# Patient Record
Sex: Female | Born: 2000 | Race: Black or African American | Hispanic: No | Marital: Single | Smoking: Current some day smoker
Health system: Southern US, Community
[De-identification: ages and names within clinical notes are randomized; demographics above are authoritative.]

## PROBLEM LIST (undated history)

## (undated) DIAGNOSIS — D573 Sickle-cell trait: Secondary | ICD-10-CM

## (undated) DIAGNOSIS — R011 Cardiac murmur, unspecified: Secondary | ICD-10-CM

## (undated) HISTORY — PX: WISDOM TOOTH EXTRACTION: SHX21

## (undated) HISTORY — PX: DILATION AND CURETTAGE OF UTERUS: SHX78

---
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
## 2020-03-31 DIAGNOSIS — A5901 Trichomonal vulvovaginitis: Secondary | ICD-10-CM | POA: Insufficient documentation

## 2021-02-27 ENCOUNTER — Encounter (HOSPITAL_BASED_OUTPATIENT_CLINIC_OR_DEPARTMENT_OTHER): Payer: Self-pay

## 2021-02-27 ENCOUNTER — Other Ambulatory Visit: Payer: Self-pay

## 2021-02-27 ENCOUNTER — Emergency Department (HOSPITAL_BASED_OUTPATIENT_CLINIC_OR_DEPARTMENT_OTHER): Payer: Medicaid Other

## 2021-02-27 ENCOUNTER — Emergency Department (HOSPITAL_BASED_OUTPATIENT_CLINIC_OR_DEPARTMENT_OTHER)
Admission: EM | Admit: 2021-02-27 | Discharge: 2021-02-27 | Discharge status: Against Medical Advice | Payer: Medicaid Other | Attending: Emergency Medicine | Admitting: Emergency Medicine

## 2021-02-27 DIAGNOSIS — R1031 Right lower quadrant pain: Secondary | ICD-10-CM | POA: Insufficient documentation

## 2021-02-27 DIAGNOSIS — R102 Pelvic and perineal pain: Secondary | ICD-10-CM | POA: Insufficient documentation

## 2021-02-27 DIAGNOSIS — R52 Pain, unspecified: Secondary | ICD-10-CM

## 2021-02-27 HISTORY — DX: Sickle-cell trait: D57.3

## 2021-02-27 HISTORY — DX: Cardiac murmur, unspecified: R01.1

## 2021-02-27 LAB — URINALYSIS, ROUTINE W REFLEX MICROSCOPIC
Bilirubin Urine: NEGATIVE
Glucose, UA: NEGATIVE mg/dL
Hgb urine dipstick: NEGATIVE
Ketones, ur: NEGATIVE mg/dL
Leukocytes,Ua: NEGATIVE
Nitrite: NEGATIVE
Protein, ur: NEGATIVE mg/dL
Specific Gravity, Urine: 1.02 (ref 1.005–1.030)
pH: 5.5 (ref 5.0–8.0)

## 2021-02-27 LAB — HCG, QUANTITATIVE, PREGNANCY: hCG, Beta Chain, Quant, S: <1 m[IU]/mL (ref <5)

## 2021-02-27 MED ORDER — ACETAMINOPHEN 500 MG PO TABS
1000.0000 mg | ORAL_TABLET | Freq: Once | ORAL | Status: AC
  Administered 2021-02-27: 1000 mg via ORAL
  Filled 2021-02-27: qty 2
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
## 2021-02-27 NOTE — ED Provider Notes (Signed)
[facility] EMERGENCY DEPARTMENT Provider Note   CSN: [id number] Arrival date & time: 02/27/21  1912     History Chief Complaint  Patient presents with   Abdominal Pain    Danielle Becker is a 20 y.o. female.  The history is provided by the patient.  Abdominal Pain Pain location: R groin pain. Pain quality: cramping   Pain radiates to:  Perineum Pain severity:  Severe Onset quality:  Sudden Duration:  1 day Timing:  Constant Progression:  Unchanged Chronicity:  New Context: not suspicious food intake and not trauma   Relieved by:  Nothing Worsened by:  Nothing Ineffective treatments:  None tried Associated symptoms: no anorexia, no belching, no chest pain, no chills, no constipation, no cough, no diarrhea, no dysuria, no fatigue, no fever, no flatus, no hematemesis, no hematochezia, no hematuria, no melena, no nausea, no shortness of breath, no sore throat, no vaginal bleeding, no vaginal discharge and no vomiting   Risk factors: no alcohol abuse       Past Medical History:  Diagnosis Date   Heart murmur    Sickle cell trait (HCC)     There are no problems to display for this patient.   Past Surgical History:  Procedure Laterality Date   DILATION AND CURETTAGE OF UTERUS       OB History   No obstetric history on file.     History reviewed. No pertinent family history.     Home Medications Prior to Admission medications   Medication Sig Start Date End Date Taking? Authorizing Provider  cephALEXin (KEFLEX) 500 MG capsule Take 1 capsule (500 mg total) by mouth 3 (three) times daily. 500mg  po tid x 10 days qs 07/22/14   14/3/15, MD  famotidine (PEPCID) 20 MG tablet Take 20 mg by mouth daily. 07/09/14   [provider]    Allergies    Patient has no known allergies.  Review of Systems   Review of Systems  Constitutional:  Negative for chills, fatigue and fever.  HENT:  Negative for sore throat.   Eyes:  Negative for redness.   Respiratory:  Negative for cough and shortness of breath.   Cardiovascular:  Negative for chest pain.  Gastrointestinal:  Positive for abdominal pain. Negative for anorexia, constipation, diarrhea, flatus, hematemesis, hematochezia, melena, nausea and vomiting.  Genitourinary:  Negative for dysuria, hematuria, vaginal bleeding and vaginal discharge.  Musculoskeletal:  Negative for neck stiffness.  Skin:  Negative for rash.  Neurological:  Negative for facial asymmetry.  Psychiatric/Behavioral:  Negative for agitation.   All other systems reviewed and are negative.  Physical Exam Updated Vital Signs BP 127/73 (BP Location: Left Arm)   Pulse 92   Resp (!) 28   Ht 5\' 3"  (1.6 m)   Wt 53.1 kg   LMP 02/22/2021   SpO2 100%   BMI 20.73 kg/m   Physical Exam Vitals and nursing note reviewed.  Constitutional:      General: She is not in acute distress.    Appearance: She is well-developed.  HENT:     Head: Normocephalic and atraumatic.     Nose: Nose normal.  Eyes:     Extraocular Movements: Extraocular movements intact.  Cardiovascular:     Rate and Rhythm: Normal rate and regular rhythm.     Pulses: Normal pulses.     Heart sounds: Normal heart sounds.  Pulmonary:     Effort: Pulmonary effort is normal.     Breath sounds: Normal  breath sounds.  Abdominal:     General: Abdomen is flat. Bowel sounds are normal.     Palpations: Abdomen is soft.     Tenderness: There is no rebound.  Musculoskeletal:        General: Normal range of motion.     Cervical back: Normal range of motion and neck supple.  Skin:    General: Skin is warm and dry.     Capillary Refill: Capillary refill takes less than 2 seconds.  Neurological:     General: No focal deficit present.     Mental Status: She is alert and oriented to person, place, and time.     Deep Tendon Reflexes: Reflexes normal.  Psychiatric:     Comments: Angry and aggressive     ED Results / Procedures / Treatments   Labs (all  labs ordered are listed, but only abnormal results are displayed) Results for orders placed or performed during the hospital encounter of 02/27/21  Urinalysis, Routine w reflex microscopic Urine, Clean Catch  Result Value Ref Range   Color, Urine YELLOW YELLOW   APPearance CLEAR CLEAR   Specific Gravity, Urine 1.020 1.005 - 1.030   pH 5.5 5.0 - 8.0   Glucose, UA NEGATIVE NEGATIVE mg/dL   Hgb urine dipstick NEGATIVE NEGATIVE   Bilirubin Urine NEGATIVE NEGATIVE   Ketones, ur NEGATIVE NEGATIVE mg/dL   Protein, ur NEGATIVE NEGATIVE mg/dL   Nitrite NEGATIVE NEGATIVE   Leukocytes,Ua NEGATIVE NEGATIVE  hCG, quantitative, pregnancy  Result Value Ref Range   hCG, Beta Chain, Quant, S <1 <5 mIU/mL   No results found.  EKG None  Radiology No results found.  Procedures Procedures   Medications Ordered in ED Medications  acetaminophen (TYLENOL) tablet 1,000 mg (has no administration in time range)    ED Course  I have reviewed the triage vital signs and the nursing notes.  Pertinent labs & imaging results that were available during my care of the patient were reviewed by me and considered in my medical decision making (see chart for details).    Patient was not pregnant and ovarian ultrasound for torsion was ordered.  Patient eloped during the care before ultrasound.    Danielle Becker was evaluated in Emergency Department on 02/27/2021 for the symptoms described in the history of present illness. She was evaluated in the context of the global COVID-19 pandemic, which necessitated consideration that the patient might be at risk for infection with the SARS-CoV-2 virus that causes COVID-19. Institutional protocols and algorithms that pertain to the evaluation of patients at risk for COVID-19 are in a state of rapid change based on information released by regulatory bodies including the CDC and federal and state organizations. These policies and algorithms were followed during the patient's  care in the ED.  Final Clinical Impression(s) / ED Diagnoses Final diagnoses:  None       Danielle Pinela, MD 02/27/21 2122

## 2021-02-27 NOTE — ED Triage Notes (Addendum)
Pt with RLQ abd pain that woke her from her sleep this AM. Pt reports associated lightheaded feeling. Denies N/V/D. Pt states pain feels like the last time she had a miscarriage.

## 2022-04-20 ENCOUNTER — Ambulatory Visit (INDEPENDENT_AMBULATORY_CARE_PROVIDER_SITE_OTHER): Payer: Medicaid Other | Admitting: Nurse Practitioner

## 2022-04-20 ENCOUNTER — Other Ambulatory Visit (INDEPENDENT_AMBULATORY_CARE_PROVIDER_SITE_OTHER): Payer: Medicaid Other

## 2022-04-20 ENCOUNTER — Encounter: Payer: Self-pay | Admitting: Nurse Practitioner

## 2022-04-20 VITALS — BP 118/68 | HR 95 | Ht 63.0 in | Wt 120.0 lb

## 2022-04-20 DIAGNOSIS — K648 Other hemorrhoids: Secondary | ICD-10-CM

## 2022-04-20 DIAGNOSIS — R1030 Lower abdominal pain, unspecified: Secondary | ICD-10-CM

## 2022-04-20 DIAGNOSIS — K625 Hemorrhage of anus and rectum: Secondary | ICD-10-CM

## 2022-04-20 DIAGNOSIS — N926 Irregular menstruation, unspecified: Secondary | ICD-10-CM

## 2022-04-20 DIAGNOSIS — R1033 Periumbilical pain: Secondary | ICD-10-CM

## 2022-04-20 DIAGNOSIS — K5900 Constipation, unspecified: Secondary | ICD-10-CM

## 2022-04-20 LAB — COMPREHENSIVE METABOLIC PANEL
ALT: 13 U/L (ref 0–35)
AST: 16 U/L (ref 0–37)
Albumin: 4.7 g/dL (ref 3.5–5.2)
Alkaline Phosphatase: 67 U/L (ref 39–117)
BUN: 11 mg/dL (ref 6–23)
CO2: 28 mEq/L (ref 19–32)
Calcium: 9.7 mg/dL (ref 8.4–10.5)
Chloride: 103 mEq/L (ref 96–112)
Creatinine, Ser: 0.84 mg/dL (ref 0.40–1.20)
GFR: 98.68 mL/min (ref >60.00)
Glucose, Bld: 72 mg/dL (ref 70–99)
Potassium: 3.6 mEq/L (ref 3.5–5.1)
Sodium: 137 mEq/L (ref 135–145)
Total Bilirubin: 0.4 mg/dL (ref 0.2–1.2)
Total Protein: 7.7 g/dL (ref 6.0–8.3)

## 2022-04-20 LAB — CBC WITH DIFFERENTIAL/PLATELET
Basophils Absolute: 0 10*3/uL (ref 0.0–0.1)
Basophils Relative: 0.9 % (ref 0.0–3.0)
Eosinophils Absolute: 0.1 10*3/uL (ref 0.0–0.7)
Eosinophils Relative: 1.3 % (ref 0.0–5.0)
HCT: 42 % (ref 36.0–46.0)
Hemoglobin: 14.3 g/dL (ref 12.0–15.0)
Lymphocytes Relative: 39.6 % (ref 12.0–46.0)
Lymphs Abs: 2.2 10*3/uL (ref 0.7–4.0)
MCHC: 34 g/dL (ref 30.0–36.0)
MCV: 92.3 fl (ref 78.0–100.0)
Monocytes Absolute: 0.5 10*3/uL (ref 0.1–1.0)
Monocytes Relative: 9.1 % (ref 3.0–12.0)
Neutro Abs: 2.7 10*3/uL (ref 1.4–7.7)
Neutrophils Relative %: 49.1 % (ref 43.0–77.0)
Platelets: 237 10*3/uL (ref 150.0–400.0)
RBC: 4.56 Mil/uL (ref 3.87–5.11)
RDW: 13.5 % (ref 11.5–15.5)
WBC: 5.5 10*3/uL (ref 4.0–10.5)

## 2022-04-20 LAB — HCG, QUANTITATIVE, PREGNANCY: Quantitative HCG: <0.6 m[IU]/mL

## 2022-04-20 LAB — TSH: TSH: 1.24 u[IU]/mL (ref 0.35–5.50)

## 2022-04-20 MED ORDER — HYDROCORTISONE ACETATE 25 MG RE SUPP: 25.0000 mg | Freq: Two times a day (BID) | RECTAL | 1 refills | Status: DC

## 2022-04-20 MED ORDER — DICYCLOMINE HCL 10 MG PO CAPS: 10.0000 mg | ORAL_CAPSULE | Freq: Three times a day (TID) | ORAL | 1 refills | Status: DC | PRN

## 2022-04-20 MED ORDER — HYDROCORTISONE ACETATE 25 MG RE SUPP: 25.0000 mg | Freq: Every evening | RECTAL | 0 refills | Status: DC

## 2022-04-20 NOTE — Progress Notes (Signed)
04/20/2022 Danielle Becker [id number] Apr 16, 2001   CHIEF COMPLAINT: Rectal bleeding  HISTORY OF PRESENT ILLNESS: Danielle Becker. Holl[place]eter is a 21 year old female with a past medical history of a heart murmur, sickle cell trait an herpes simplex I. Past wisdom teeth extraction and ectopic pregnancy surgery. She presents today as referred by Dr. Arther Abbott for further evaluation regarding hemorrhoids and rectal bleeding. [place] endorses having intermittent rectal bleeding for 2 to 3 months. She describes seeing bright red blood on the stool and toilet tissue which occurs if she pushes too hard to pass a stool or if she sits on the commode for a long time, sometimes sits on the commode for one hour.  Stool is formed and normal brown in color.  No black stools.  She has intermittent right and left lower abdominal pain. She has sharp central abdominal pain which last for 1 to 2 minutes and occurs if she does not eat breakfast shortly after waking up and eats after she arrives to work. No specific food triggers.  No dysphagia or heartburn.  Her central abdominal pain started following her ectopic pregnancy surgery in 2019.  Infrequent NSAID use.  She has mid chest pain which lasts less than 10 seconds which occurs 2-3 times daily for the past year which is unrelated to eating.  Stress level reported as low.  She drinks less than 64 ounces of water daily.  Past Medical History:  Diagnosis Date   Heart murmur    Sickle cell trait (HCC)    Past Surgical History:  Procedure Laterality Date   DILATION AND CURETTAGE OF UTERUS     Social History: Single. She smokes one cigarette daily since 2017. Rare alcohol use. No drug use.   Family History: Mother age 26 with heart disease, pacemaker. Father age 6 with history of asthma. One brother is healthy. Sister with numerous allergies. Sister healthy. Maternal aunt had brain cancer. Paternal aunt with COPD and lung cancer.   No Known Allergies    Outpatient  Encounter Medications as of 04/20/2022  Medication Sig   cephALEXin (KEFLEX) 500 MG capsule Take 1 capsule (500 mg total) by mouth 3 (three) times daily. 500mg  po tid x 10 days qs   famotidine (PEPCID) 20 MG tablet Take 20 mg by mouth daily.   No facility-administered encounter medications on file as of 04/20/2022.    REVIEW OF SYSTEMS:  Gen: Denies fever, sweats or chills. No weight loss.  CV: Denies chest pain, palpitations or edema. Resp: Denies cough, shortness of breath of hemoptysis.  GI: See HPI.   GU : Denies urinary burning, blood in urine, increased urinary frequency or incontinence. MS: Denies joint pain, muscles aches or weakness. Derm: Denies rash, itchiness, skin lesions or unhealing ulcers. Psych: Denies depression, anxiety or memory loss. Heme: Denies bruising, easy bleeding. Neuro:  Denies headaches, dizziness or paresthesias. Endo:  Denies any problems with DM, thyroid or adrenal function.  PHYSICAL EXAM:  LMP 716/2023, sexually active, does not use birth control  BP 118/68   Pulse 95   Ht 5\' 3"  (1.6 m)   Wt 120 lb (54.4 kg)   LMP 04/04/2022   SpO2 98%   BMI 21.26 kg/m   General: 21 year old female in no acute distress. Head: Normocephalic and atraumatic. Eyes:  Sclerae non-icteric, conjunctive pink. Ears: Normal auditory acuity. Mouth: Dentition intact. No ulcers or lesions.  Neck: Supple, no lymphadenopathy or thyromegaly.  Lungs: Clear bilaterally to auscultation without wheezes, crackles or rhonchi.  Heart: Regular rate and rhythm. No murmur, rub or gallop appreciated.  Abdomen: Soft, nontender, non distended. No masses. No hepatosplenomegaly. Normoactive bowel sounds x 4 quadrants.  Rectal: No external hemorrhoids or fissures.  Internal hemorrhoids without prolapse.  No blood or mass.  Marchelle Folks CMA present during exam. Musculoskeletal: Symmetrical with no gross deformities. Skin: Warm and dry. No rash or lesions on visible extremities. Extremities: No  edema. Neurological: Alert oriented x 4, no focal deficits.  Psychological:  Alert and cooperative. Normal mood and affect.  ASSESSMENT AND PLAN:  59) 21 year old female with painless rectal bleeding, suspect secondary to internal hemorrhoids and constipation  -Avoid straining  -Miralax Q HS -Apply a small amount of Desitin inside the anal opening and to the external anal area tid as needed for anal or hemorrhoidal irritation/bleeding.  -Anusol HC suppository 25mg  one PR QHS x 5 nights -Increase water intake to 8 glasses (64 oz) daily -CBC, CMP and CRP -Further recommendations to be determined after lab results reviewed -Follow-up in 6 to 8 weeks  2) Central/periumbilical pain, occurs if breakfast delayed.  -Avoid skipping meals -Take Dicyclomine 10 mg prior to breakfast as needed, may take every 8 hours as needed for other abdominal pains as well   3) Lower abdominal pain, intermittent. Likely due to constipation. Negative abdominal exam.  -See plan in #1 and # 2 -CTAP if abdominal pains persist or worsen   4) Chest pain, unrelated to eating. No chest pain at this time.  -Check TSH -Consider Trial with Prilosec 20mg  QD -Establish PCP to further evaluate chest pain   5) Irregular menstrual cycle. LMP 03/04/2022 -Beta HCG quant      CC:  , MD

## 2022-04-20 NOTE — Patient Instructions (Addendum)
If you are age 22 or younger, your body mass index should be between 19-25. Your Body mass index is 21.26 kg/m. If this is out of the aformentioned range listed, please consider follow up with your Primary Care Provider.  ________________________________________________________  The [facility] providers would like to encourage you to use [facility] to communicate with providers for non-urgent requests or questions.  Due to long hold times on the telephone, sending your provider a message by [facility] may be a faster and more efficient way to get a response.  Please allow 48 business hours for a response.  Please remember that this is for non-urgent requests.  _______________________________________________________  Your provider has requested that you go to the basement level for lab work before leaving today. Press "B" on the elevator. The lab is located at the first door on the left as you exit the elevator.  START Anusol suppositories 1 suppository inserted rectal at night time  START Dicyclomine 10 mg 1 capsule every 8 hours as needed for abdominal pain  Take Miralax 1 capful mixed in 8 ounces of water at bed time for constipation as tolerated.  Apply a small amount of Desitin inside the anal opening and to the external anal area tid as needed for anal or hemorrhoidal irritation/bleeding.   Increase your water intake to 8 glasses or 64 ounces daily  Follow up with Alcide Evener in 6-8 weeks.  Thank you for entrusting me with your care and choosing [facility].  Alcide Evener, PA-C

## 2022-04-20 NOTE — Progress Notes (Signed)
Agree with assessment and plan as outlined.  If symptoms persist despite conservative measures can consider hemorrhoid banding.  Thanks

## 2022-05-31 ENCOUNTER — Ambulatory Visit: Payer: Medicaid Other | Admitting: Physician Assistant

## 2022-08-21 ENCOUNTER — Encounter: Payer: Self-pay | Admitting: General Practice

## 2022-09-13 ENCOUNTER — Ambulatory Visit (INDEPENDENT_AMBULATORY_CARE_PROVIDER_SITE_OTHER): Payer: Medicaid Other | Admitting: Podiatry

## 2022-09-13 ENCOUNTER — Encounter: Payer: Self-pay | Admitting: Podiatry

## 2022-09-13 DIAGNOSIS — D573 Sickle-cell trait: Secondary | ICD-10-CM | POA: Insufficient documentation

## 2022-09-13 DIAGNOSIS — B009 Herpesviral infection, unspecified: Secondary | ICD-10-CM | POA: Insufficient documentation

## 2022-09-13 DIAGNOSIS — B353 Tinea pedis: Secondary | ICD-10-CM | POA: Diagnosis not present

## 2022-09-13 MED ORDER — TERBINAFINE HCL 250 MG PO TABS: 250.0000 mg | ORAL_TABLET | Freq: Every day | ORAL | 0 refills | Status: AC

## 2022-09-13 NOTE — Progress Notes (Signed)
  Subjective:  Patient ID: Danielle Becker, female    DOB: 08/03/2001,  MRN: [id number] HPI Chief Complaint  Patient presents with   Skin Problem    Interdigital bilateral - cracked, peeling skin with odor, macerated at times, tried antifungal Becker-no help, keeps feet dried good after showers   New Patient (Initial Visit)    22 y.o. female presents with the above complaint.   ROS: Denies fever chills nausea vomit muscle aches pains calf pain back pain chest pain shortness of breath  Past Medical History:  Diagnosis Date   Heart murmur    Sickle cell trait ([place])    Past Surgical History:  Procedure Laterality Date   DILATION AND CURETTAGE OF UTERUS     WISDOM TOOTH EXTRACTION      Current Outpatient Medications:    ESTARYLLA 0.25-35 MG-MCG tablet, Take 1 tablet by mouth daily., Disp: , Rfl:    Prenat w/o A-FE-Methfol-FA-DHA (WESCAP-PN DHA) 27-0.6-0.4-300 MG CAPS, Take 1 capsule by mouth daily., Disp: , Rfl:    terbinafine (LAMISIL) 250 MG tablet, Take 1 tablet (250 mg total) by mouth daily., Disp: 30 tablet, Rfl: 0   valACYclovir (VALTREX) 500 MG tablet, Take by mouth., Disp: , Rfl:   No Known Allergies Review of Systems Objective:  There were no vitals filed for this visit.  General: Well developed, nourished, in no acute distress, alert and oriented x3   Dermatological: Skin is warm, dry and supple bilateral. Nails x 10 are well maintained; remaining integument appears unremarkable at this time. There are no open sores, no preulcerative lesions, no rash or signs of infection present.  Interdigital tinea pedis.  Vascular: Dorsalis Pedis artery and Posterior Tibial artery pedal pulses are 2/4 bilateral with immedate capillary fill time. Pedal hair growth present. No varicosities and no lower extremity edema present bilateral.   Neruologic: Grossly intact via light touch bilateral. Vibratory intact via tuning fork bilateral. Protective threshold with Semmes Wienstein  monofilament intact to all pedal sites bilateral. Patellar and Achilles deep tendon reflexes 2+ bilateral. No Babinski or clonus noted bilateral.   Musculoskeletal: No gross boney pedal deformities bilateral. No pain, crepitus, or limitation noted with foot and ankle range of motion bilateral. Muscular strength 5/5 in all groups tested bilateral.  Gait: Unassisted, Nonantalgic.    Radiographs:  None taken  Assessment & Plan:   Assessment: Interdigital tinea pedis  Plan: Start her on Lamisil 250 mg tablets 1 p.o. daily follow-up with her in 1 month     Danielle Becker T. [place], [place]

## 2022-10-12 ENCOUNTER — Ambulatory Visit: Payer: 59 | Admitting: Obstetrics and Gynecology

## 2022-10-12 ENCOUNTER — Encounter: Payer: Self-pay | Admitting: Obstetrics and Gynecology

## 2022-10-12 VITALS — BP 112/70 | HR 97 | Ht 63.0 in | Wt 124.0 lb

## 2022-10-12 DIAGNOSIS — N946 Dysmenorrhea, unspecified: Secondary | ICD-10-CM | POA: Diagnosis not present

## 2022-10-12 DIAGNOSIS — N914 Secondary oligomenorrhea: Secondary | ICD-10-CM | POA: Diagnosis not present

## 2022-10-12 NOTE — Progress Notes (Unsigned)
NEW GYNECOLOGY PATIENT Patient name: Danielle Becker MRN [id number]  Date of birth: 2001/01/19 Chief Complaint:   No chief complaint on file.     History:  CAMARY HAMADE is a 22 y.o. G1P0010 being seen today for PCOS second opinion. Having lower abdominal pain and pain after eating. Pain with having BM. Menses are currently heavier and more painful. Previoulsy woul dhave menses without symptoms and no whaving pain in right shoulder with menses. Mense in right shoulder for about 3-4 months. On OCPs the menses are regular. Was kind of irregular previously. Would miss a few months wthout birth control. No issues with weight. No issues with hair growth; having acne now. Breaking out more frequently on the skin and now havin gless pain when eating sicne being on OCPs. More cramping pain with hte bleeding and shoulder; not skipping periods; preference to have ameses and would prefer to not see it. Currently sexually active; female partner with no intetion of prengany soon. No pain with intercourse, no dysuria, still having pain with BM. Blood in stool - hemorrhoids, no blood in urine.   Prior: nexplanon (constant bleeding), depo (inconsistent use), pills (been ok), nuvaring (didn't stay inside). No prior IUD (felt like it was being forced upon her).    He did [place] - said ovaries were enlarged.        Gynecologic History Patient's last menstrual period was 10/05/2022 (exact date). Contraception: {method:5051} Last Pap: ***. Result was {norm/abn:16337} with negative HPV Last Mammogram: ***.  Result was {norm/abn:16337} Last Colonoscopy: ***.  Result was {norm/abn:16337}  Obstetric History OB History  Gravida Para Term Preterm AB Living  1       1    SAB IAB Ectopic Multiple Live Births      1        # Outcome Date GA Lbr Len/2nd Weight Sex Delivery Anes PTL Lv  1 Ectopic 2016            Past Medical History:  Diagnosis Date   Heart murmur    Sickle cell trait ([place])     Past Surgical  History:  Procedure Laterality Date   DILATION AND CURETTAGE OF UTERUS     WISDOM TOOTH EXTRACTION      Current Outpatient Medications on File Prior to Visit  Medication Sig Dispense Refill   ESTARYLLA 0.25-35 MG-MCG tablet Take 1 tablet by mouth daily.     Prenat w/o A-FE-Methfol-FA-DHA (WESCAP-PN DHA) 27-0.6-0.4-300 MG CAPS Take 1 capsule by mouth daily.     valACYclovir (VALTREX) 500 MG tablet Take by mouth.     terbinafine (LAMISIL) 250 MG tablet Take 1 tablet (250 mg total) by mouth daily. (Patient not taking: Reported on 10/12/2022) 30 tablet 0   No current facility-administered medications on file prior to visit.    No Known Allergies  Social History:  reports that she has been smoking cigars. She started smoking about 7 years ago. She does not have any smokeless tobacco history on file. She reports that she does not drink alcohol and does not use drugs.  Family History  Problem Relation Age of Onset   Heart disease Mother     The following portions of the patient's history were reviewed and updated as appropriate: allergies, current medications, past family history, past medical history, past social history, past surgical history and problem list.  Review of Systems Pertinent items noted in HPI and remainder of comprehensive ROS otherwise negative.  Physical Exam:  BP 112/70  Pulse 97   Ht '5\' 3"'$  (1.6 m)   Wt 124 lb (56.2 kg)   LMP 10/05/2022 (Exact Date)   BMI 21.97 kg/m  Physical Exam     Assessment and Plan:   There are no diagnoses linked to this encounter.   Routine preventative health maintenance measures emphasized. Please refer to After Visit Summary for other counseling recommendations.   Follow-up: No follow-ups on file.      Darliss Cheney, MD Obstetrician & Gynecologist, [facility] [employer], [place]

## 2022-10-15 ENCOUNTER — Encounter: Payer: Self-pay | Admitting: General Practice

## 2022-10-16 ENCOUNTER — Ambulatory Visit (INDEPENDENT_AMBULATORY_CARE_PROVIDER_SITE_OTHER): Payer: Medicaid Other | Admitting: Podiatry

## 2022-10-16 DIAGNOSIS — B353 Tinea pedis: Secondary | ICD-10-CM | POA: Diagnosis not present

## 2022-10-16 NOTE — Progress Notes (Signed)
She presents today for follow-up of her severe interdigital tinea pedis and plantar tinea pedis bilateral.  States that she was only able to take a few Lamisil tablets because they caused constipation and diarrhea.  She states she is very happy with the outcome of mobic which she did take.  She is using antiperspirant spray as well.  Objective: Vital signs are stable alert and oriented x 3.  Pulses are palpable.  There is no erythema edema salines drainage or odor interdigital tinea pedis is resolved with exception of the fourth and third interdigital spaces of the right foot.  No visible tinea on the plantar aspect of the foot.  Assessment: Resolving tinea pedis.  Plan: I instructed her to start taking 1/2 tablet every other day as tolerated to get rid of the remainder portion of the athlete's foot.  She will follow-up with me with any questions or concerns.

## 2022-11-13 DIAGNOSIS — Z113 Encounter for screening for infections with a predominantly sexual mode of transmission: Secondary | ICD-10-CM | POA: Diagnosis not present

## 2022-11-13 DIAGNOSIS — Z3202 Encounter for pregnancy test, result negative: Secondary | ICD-10-CM | POA: Diagnosis not present

## 2023-08-02 ENCOUNTER — Emergency Department (HOSPITAL_BASED_OUTPATIENT_CLINIC_OR_DEPARTMENT_OTHER)
Admission: EM | Admit: 2023-08-02 | Discharge: 2023-08-02 | Disposition: A | Discharge status: Home / Self Care | Payer: Medicaid Other | Attending: Emergency Medicine | Admitting: Emergency Medicine

## 2023-08-02 ENCOUNTER — Encounter (HOSPITAL_BASED_OUTPATIENT_CLINIC_OR_DEPARTMENT_OTHER): Payer: Self-pay

## 2023-08-02 ENCOUNTER — Other Ambulatory Visit: Payer: Self-pay

## 2023-08-02 DIAGNOSIS — A549 Gonococcal infection, unspecified: Secondary | ICD-10-CM | POA: Insufficient documentation

## 2023-08-02 DIAGNOSIS — Z202 Contact with and (suspected) exposure to infections with a predominantly sexual mode of transmission: Secondary | ICD-10-CM | POA: Diagnosis present

## 2023-08-02 LAB — PREGNANCY, URINE: Preg Test, Ur: NEGATIVE

## 2023-08-02 LAB — URINALYSIS, MICROSCOPIC (REFLEX)

## 2023-08-02 LAB — URINALYSIS, ROUTINE W REFLEX MICROSCOPIC
Bilirubin Urine: NEGATIVE
Glucose, UA: NEGATIVE mg/dL
Ketones, ur: NEGATIVE mg/dL
Leukocytes,Ua: NEGATIVE
Nitrite: NEGATIVE
Protein, ur: NEGATIVE mg/dL
Specific Gravity, Urine: 1.02 (ref 1.005–1.030)
pH: 6.5 (ref 5.0–8.0)

## 2023-08-02 LAB — WET PREP, GENITAL
Clue Cells Wet Prep HPF POC: NONE SEEN
Sperm: NONE SEEN
Trich, Wet Prep: NONE SEEN
WBC, Wet Prep HPF POC: <10 (ref <10)
Yeast Wet Prep HPF POC: NONE SEEN

## 2023-08-02 LAB — HIV ANTIBODY (ROUTINE TESTING W REFLEX): HIV Screen 4th Generation wRfx: NONREACTIVE

## 2023-08-02 LAB — RPR: RPR Ser Ql: NONREACTIVE

## 2023-08-02 MED ORDER — CEFTRIAXONE SODIUM 500 MG IJ SOLR
500.0000 mg | Freq: Once | INTRAMUSCULAR | Status: AC
Start: 2023-08-02 — End: 2023-08-02
  Administered 2023-08-02: 500 mg via INTRAMUSCULAR
  Filled 2023-08-02: qty 500

## 2023-08-02 MED ORDER — LIDOCAINE HCL (PF) 1 % IJ SOLN
INTRAMUSCULAR | Status: AC
  Administered 2023-08-02: 5 mL
  Filled 2023-08-02: qty 5

## 2023-08-02 NOTE — ED Triage Notes (Signed)
Pt arrives with c/o STD exposure. Pt reports being exposed to gonorrhea. Pt endorse vaginal discharge and itchiness.

## 2023-08-02 NOTE — ED Notes (Signed)
Pt states she has been exposed to gonorrhea  Swabs sent  No c/o pain

## 2023-08-02 NOTE — ED Provider Notes (Signed)
[facility] Provider Note   CSN: [id number] Arrival date & time: 08/02/23  0028     History  Chief Complaint  Patient presents with   Exposure to STD    Danielle Becker is a 22 y.o. female.  The history is provided by the patient.  Exposure to STD  Danielle Becker is a 22 y.o. female who presents to the Emergency Department complaining of exposure to gonorrhea.  She was contacted by partner and was told that she was exposed to gonorrhea.  Last intercourse was in the last few days.  She does report that her menstrual cycle did start early and has a yellow vaginal discharge with a slight odor.  No fever, nausea, vomiting.  She does feel different as what she describes.  No known medical problems.    Home Medications Prior to Admission medications   Medication Sig Start Date End Date Taking? Authorizing Provider  ESTARYLLA 0.25-35 MG-MCG tablet Take 1 tablet by mouth daily. 08/16/22   [provider]  Prenat w/o A-FE-Methfol-FA-DHA (WESCAP-PN DHA) 27-0.6-0.4-300 MG CAPS Take 1 capsule by mouth daily. 08/16/22   [provider]  terbinafine (LAMISIL) 250 MG tablet Take 1 tablet (250 mg total) by mouth daily. Patient not taking: Reported on 10/12/2022 09/13/22   Elinor Parkinson, DPM  valACYclovir (VALTREX) 500 MG tablet Take by mouth. 08/09/22   [provider]      Allergies    Patient has no known allergies.    Review of Systems   Review of Systems  All other systems reviewed and are negative.   Physical Exam Updated Vital Signs BP 102/68   Pulse 82   Temp 97.8 F (36.6 C) (Oral)   Resp 18   Wt 56.7 kg   SpO2 100%   BMI 22.14 kg/m  Physical Exam Vitals and nursing note reviewed.  Constitutional:      Appearance: She is well-developed.  HENT:     Head: Normocephalic and atraumatic.  Cardiovascular:     Rate and Rhythm: Normal rate.  Pulmonary:     Effort: Pulmonary effort is normal. No  respiratory distress.  Abdominal:     Palpations: Abdomen is soft.     Tenderness: There is no abdominal tenderness. There is no guarding or rebound.  Genitourinary:    Comments: There is a small amount of old blood in the vaginal vault, no CMT.  No significant vaginal discharge Musculoskeletal:        General: No tenderness.  Skin:    General: Skin is warm and dry.  Neurological:     Mental Status: She is alert and oriented to person, place, and time.  Psychiatric:        Behavior: Behavior normal.     ED Results / Procedures / Treatments   Labs (all labs ordered are listed, but only abnormal results are displayed) Labs Reviewed  URINALYSIS, ROUTINE W REFLEX MICROSCOPIC - Abnormal; Notable for the following components:      Result Value   APPearance HAZY (*)    Hgb urine dipstick TRACE (*)    All other components within normal limits  URINALYSIS, MICROSCOPIC (REFLEX) - Abnormal; Notable for the following components:   Bacteria, UA FEW (*)    All other components within normal limits  WET PREP, GENITAL  PREGNANCY, URINE  RPR  HIV ANTIBODY (ROUTINE TESTING W REFLEX)  GC/CHLAMYDIA PROBE AMP ([place]) NOT AT [facility]    EKG None  Radiology No results found.  Procedures Procedures    Medications Ordered in ED Medications  cefTRIAXone (ROCEPHIN) injection 500 mg (has no administration in time range)  lidocaine (PF) (XYLOCAINE) 1 % injection (has no administration in time range)    ED Course/ Medical Decision Making/ A&P                                 Medical Decision Making Amount and/or Complexity of Data Reviewed Labs: ordered.  Risk Prescription drug management.   Patient here for evaluation following potential exposure to gonorrhea.  No clinical evidence of tubo-ovarian abscess, PID.  Will treat with Rocephin.  Will send chlamydia, HIV and syphilis.  Plan to discharge with outpatient follow-up and return precautions.        Final Clinical  Impression(s) / ED Diagnoses Final diagnoses:  STD exposure    Rx / DC Orders ED Discharge Orders     None         Tilden Fossa, MD 08/02/23 [phone]

## 2023-08-05 LAB — GC/CHLAMYDIA PROBE AMP (~~LOC~~) NOT AT ARMC
Chlamydia: NEGATIVE
Comment: NEGATIVE
Comment: NORMAL
Neisseria Gonorrhea: POSITIVE — AB
